# Patient Record
Sex: Female | Born: 1945 | Hispanic: Yes
Health system: Southern US, Community
[De-identification: ages and names within clinical notes are randomized; demographics above are authoritative.]

## PROBLEM LIST (undated history)

## (undated) DIAGNOSIS — J449 Chronic obstructive pulmonary disease, unspecified: Secondary | ICD-10-CM

---

## 2022-02-17 ENCOUNTER — Emergency Department (HOSPITAL_COMMUNITY): Payer: Medicare HMO

## 2022-02-17 ENCOUNTER — Encounter (HOSPITAL_COMMUNITY): Payer: Self-pay

## 2022-02-17 ENCOUNTER — Emergency Department (HOSPITAL_COMMUNITY)
Admission: EM | Admit: 2022-02-17 | Discharge: 2022-02-17 | Disposition: A | Discharge status: Home / Self Care | Payer: Medicare HMO | Attending: Emergency Medicine | Admitting: Emergency Medicine

## 2022-02-17 DIAGNOSIS — R109 Unspecified abdominal pain: Secondary | ICD-10-CM

## 2022-02-17 DIAGNOSIS — R110 Nausea: Secondary | ICD-10-CM | POA: Insufficient documentation

## 2022-02-17 DIAGNOSIS — M7918 Myalgia, other site: Secondary | ICD-10-CM | POA: Diagnosis not present

## 2022-02-17 DIAGNOSIS — R1032 Left lower quadrant pain: Secondary | ICD-10-CM | POA: Insufficient documentation

## 2022-02-17 LAB — COMPREHENSIVE METABOLIC PANEL
ALT: 13 U/L (ref 0–44)
AST: 17 U/L (ref 15–41)
Albumin: 4.2 g/dL (ref 3.5–5.0)
Alkaline Phosphatase: 93 U/L (ref 38–126)
Anion gap: 6 (ref 5–15)
BUN: 18 mg/dL (ref 8–23)
CO2: 26 mmol/L (ref 22–32)
Calcium: 9.1 mg/dL (ref 8.9–10.3)
Chloride: 105 mmol/L (ref 98–111)
Creatinine, Ser: 0.71 mg/dL (ref 0.44–1.00)
GFR, Estimated: >60 mL/min (ref >60)
Glucose, Bld: 110 mg/dL — ABNORMAL HIGH (ref 70–99)
Potassium: 4 mmol/L (ref 3.5–5.1)
Sodium: 137 mmol/L (ref 135–145)
Total Bilirubin: 0.7 mg/dL (ref 0.3–1.2)
Total Protein: 8.2 g/dL — ABNORMAL HIGH (ref 6.5–8.1)

## 2022-02-17 LAB — URINALYSIS, ROUTINE W REFLEX MICROSCOPIC
Bilirubin Urine: NEGATIVE
Glucose, UA: NEGATIVE mg/dL
Ketones, ur: NEGATIVE mg/dL
Leukocytes,Ua: NEGATIVE
Nitrite: NEGATIVE
Protein, ur: NEGATIVE mg/dL
Specific Gravity, Urine: 1.009 (ref 1.005–1.030)
pH: 5 (ref 5.0–8.0)

## 2022-02-17 LAB — CBC WITH DIFFERENTIAL/PLATELET
Abs Immature Granulocytes: 0.02 10*3/uL (ref 0.00–0.07)
Basophils Absolute: 0.1 10*3/uL (ref 0.0–0.1)
Basophils Relative: 1 %
Eosinophils Absolute: 0.1 10*3/uL (ref 0.0–0.5)
Eosinophils Relative: 2 %
HCT: 44.3 % (ref 36.0–46.0)
Hemoglobin: 15.2 g/dL — ABNORMAL HIGH (ref 12.0–15.0)
Immature Granulocytes: 0 %
Lymphocytes Relative: 12 %
Lymphs Abs: 1.1 10*3/uL (ref 0.7–4.0)
MCH: 32.1 pg (ref 26.0–34.0)
MCHC: 34.3 g/dL (ref 30.0–36.0)
MCV: 93.7 fL (ref 80.0–100.0)
Monocytes Absolute: 0.4 10*3/uL (ref 0.1–1.0)
Monocytes Relative: 5 %
Neutro Abs: 7.2 10*3/uL (ref 1.7–7.7)
Neutrophils Relative %: 80 %
Platelets: 261 10*3/uL (ref 150–400)
RBC: 4.73 MIL/uL (ref 3.87–5.11)
RDW: 12.6 % (ref 11.5–15.5)
WBC: 9 10*3/uL (ref 4.0–10.5)
nRBC: 0 % (ref 0.0–0.2)

## 2022-02-17 LAB — LIPASE, BLOOD: Lipase: 27 U/L (ref 11–51)

## 2022-02-17 MED ORDER — IOHEXOL 350 MG/ML SOLN
100.0000 mL | Freq: Once | INTRAVENOUS | Status: AC | PRN
  Administered 2022-02-17: 100 mL via INTRAVENOUS

## 2022-02-17 MED ORDER — CYCLOBENZAPRINE HCL 7.5 MG PO TABS
7.5000 mg | ORAL_TABLET | Freq: Two times a day (BID) | ORAL | 0 refills | Status: DC | PRN
Start: 2022-02-17 — End: 2023-08-02

## 2022-02-17 MED ORDER — LACTATED RINGERS IV BOLUS
1000.0000 mL | Freq: Once | INTRAVENOUS | Status: AC
  Administered 2022-02-17: 1000 mL via INTRAVENOUS

## 2022-02-17 MED ORDER — ONDANSETRON HCL 4 MG/2ML IJ SOLN
4.0000 mg | Freq: Once | INTRAMUSCULAR | Status: AC
  Administered 2022-02-17: 4 mg via INTRAVENOUS
  Filled 2022-02-17: qty 2

## 2022-02-17 MED ORDER — KETOROLAC TROMETHAMINE 15 MG/ML IJ SOLN
15.0000 mg | Freq: Once | INTRAMUSCULAR | Status: AC
  Administered 2022-02-17: 15 mg via INTRAVENOUS
  Filled 2022-02-17: qty 1

## 2022-02-17 MED ORDER — SODIUM CHLORIDE (PF) 0.9 % IJ SOLN
INTRAMUSCULAR | Status: AC
  Filled 2022-02-17: qty 50

## 2022-02-17 NOTE — ED Triage Notes (Signed)
Pt presents with c/o left lower quad abdominal pain that radiates to her left flank area. Pt reports pain has been present for approx one week, nausea as well. ?

## 2022-02-17 NOTE — Discharge Instructions (Signed)
Your workup today was very reassuring. Your labs looked great and there were no abnormalities seen on you CT scan. We believe that your symptoms may in fact be muscular in nature. I recommend using motrin or tylenol as needed for discomfort, and I have sent you a muscle relaxer that may help as well. If symptoms continue longer than a week, please follow up with your PCP ?

## 2022-02-17 NOTE — ED Provider Notes (Signed)
?[facility] ?Provider Note ? ? ?CSN: [id number] ?Arrival date & time: 02/17/22  [id number] ? ?  ? ?History ? ?Chief Complaint  ?Patient presents with  ? Abdominal Pain  ? ? ?Diana Moody is a 76 y.o. female who presents to the ED for left flank pain radiating to her left lower quadrant  that has been present for about 1 week.  Patient describes the pain as pressure, similar to "giving birth".  Patient's daughter is with her and reports that the left flank also appears swollen to her compared to the right.  She also endorses nausea but denies diarrhea.  She denies chest pain, shortness of breath, urinary symptoms.  No previous history of kidney stones.  No treatment prior to arrival.  Pt has history of cholecystectomy but no other previous history of abdominal surgery. ? ? ?Abdominal Pain ? ?  ? ?Home Medications ?Prior to Admission medications   ?Medication Sig Start Date End Date Taking? Authorizing Provider  ?cyclobenzaprine (FEXMID) 7.5 MG tablet Take 1 tablet (7.5 mg total) by mouth 2 (two) times daily as needed for muscle spasms. 02/17/22  Yes Tonye Pearson, PA-C  ?   ? ?Allergies    ?Patient has no known allergies.   ? ?Review of Systems   ?Review of Systems  ?Gastrointestinal:  Positive for abdominal pain.  ? ?Physical Exam ?Updated Vital Signs ?BP 125/60   Pulse 77   Temp 98 ?F (36.7 ?C)   Resp 17   SpO2 94%  ?Physical Exam ?Vitals and nursing note reviewed.  ?Constitutional:   ?   General: She is not in acute distress. ?   Appearance: She is not ill-appearing.  ?HENT:  ?   Head: Atraumatic.  ?Eyes:  ?   Conjunctiva/sclera: Conjunctivae normal.  ?Cardiovascular:  ?   Rate and Rhythm: Normal rate and regular rhythm.  ?   Pulses: Normal pulses.  ?   Heart sounds: No murmur heard. ?Pulmonary:  ?   Effort: Pulmonary effort is normal. No respiratory distress.  ?   Breath sounds: Normal breath sounds.  ?Abdominal:  ?   General: Abdomen is flat. There is no distension.  ?    Palpations: Abdomen is soft.  ?   Tenderness: There is abdominal tenderness.  ?   Comments: Tender to palpation of the left flank rating down to the left lower quadrant.  Left CVA tenderness. ? ?Negative Murphy sign, negative McBurney ? ?No appreciable swelling of the left flank  ?Musculoskeletal:     ?   General: Normal range of motion.  ?   Cervical back: Normal range of motion.  ?Skin: ?   General: Skin is warm and dry.  ?   Capillary Refill: Capillary refill takes less than 2 seconds.  ?Neurological:  ?   General: No focal deficit present.  ?   Mental Status: She is alert.  ?Psychiatric:     ?   Mood and Affect: Mood normal.  ? ? ?ED Results / Procedures / Treatments   ?Labs ?(all labs ordered are listed, but only abnormal results are displayed) ?Labs Reviewed  ?COMPREHENSIVE METABOLIC PANEL - Abnormal; Notable for the following components:  ?    Result Value  ? Glucose, Bld 110 (*)   ? Total Protein 8.2 (*)   ? All other components within normal limits  ?CBC WITH DIFFERENTIAL/PLATELET - Abnormal; Notable for the following components:  ? Hemoglobin 15.2 (*)   ? All other components within normal limits  ?  URINALYSIS, ROUTINE W REFLEX MICROSCOPIC - Abnormal; Notable for the following components:  ? Color, Urine STRAW (*)   ? Hgb urine dipstick MODERATE (*)   ? Bacteria, UA RARE (*)   ? All other components within normal limits  ?LIPASE, BLOOD  ? ? ?EKG ?None ? ?Radiology ?CT Angio Abd/Pel W and/or Wo Contrast ? ?Result Date: 02/17/2022 ?CLINICAL DATA:  Left lower quadrant pain x1 week, nausea EXAM: CTA ABDOMEN AND PELVIS WITHOUT AND WITH CONTRAST TECHNIQUE: Multidetector CT imaging of the abdomen and pelvis was performed using the standard protocol during bolus administration of intravenous contrast. Multiplanar reconstructed images and MIPs were obtained and reviewed to evaluate the vascular anatomy. RADIATION DOSE REDUCTION: This exam was performed according to the departmental dose-optimization program which  includes automated exposure control, adjustment of the mA and/or kV according to patient size and/or use of iterative reconstruction technique. CONTRAST:  176mL OMNIPAQUE IOHEXOL 350 MG/ML SOLN COMPARISON:  MR 12/06/2020, and previous FINDINGS: VASCULAR Aorta: Moderate scattered calcified atheromatous plaque. No aneurysm, dissection, or stenosis. Celiac: Patent without evidence of aneurysm, dissection, vasculitis or significant stenosis. SMA: Calcified ostial plaque resulting in short segment stenosis of at least mild severity, mildly atheromatous but patent distally with classic distal branch anatomy. Renals: Single bilaterally, both with partially calcified ostial plaque, no high-grade stenosis. IMA: Patent without evidence of aneurysm, dissection, vasculitis or significant stenosis. Inflow: Scattered common and internal iliac calcified plaque without aneurysm or stenosis. Proximal Outflow: Mildly atheromatous, patent Veins: Patent hepatic veins, portal vein, SM V, splenic vein, bilateral renal veins. Iliac venous system and IVC unremarkable. No venous pathology evident. Review of the MIP images confirms the above findings. NON-VASCULAR Lower chest: No pleural or pericardial effusion. Coronary calcifications. Hepatobiliary: Post cholecystectomy. Mild prominence of the central intrahepatic biliary tree and CBD, grossly stable since 11/08/2020. 2.3 cm benign hepatic cyst in segment 5. no new liver lesion. Pancreas: Unremarkable. No pancreatic ductal dilatation or surrounding inflammatory changes. Spleen: Normal in size without focal abnormality. Adrenals/Urinary Tract: 2.4 cm left adrenal nodule, previously characterized as adenoma on MRI. Right adrenal fullness, stable. Symmetric renal parenchymal enhancement without focal lesion or hydronephrosis. Urinary bladder physiologically distended. Stomach/Bowel: Stomach incompletely distended, unremarkable. Small bowel decompressed. Normal appendix. The colon is  nondilated with scattered descending and sigmoid diverticula; no significant adjacent inflammatory change. Lymphatic: No abdominal or pelvic adenopathy. Reproductive: Uterus and bilateral adnexa are unremarkable. Other: No ascites.  No free air. Musculoskeletal: Mild facet DJD in the lower lumbar spine. IMPRESSION: 1. No acute findings. 2. Coronary and aortic Atherosclerosis (ICD10-170.0) involving branch vessels without high-grade stenosis. 3. Descending and sigmoid diverticulosis 4. Stable left adrenal benign adenoma. Electronically Signed   By: Lucrezia Europe M.D.   On: 02/17/2022 09:39   ? ?Procedures ?Procedures  ? ? ?Medications Ordered in ED ?Medications  ?lactated ringers bolus 1,000 mL (0 mLs Intravenous Stopped 02/17/22 0918)  ?ketorolac (TORADOL) 15 MG/ML injection 15 mg (15 mg Intravenous Given 02/17/22 0757)  ?ondansetron [facility]) injection 4 mg (4 mg Intravenous Given 02/17/22 0757)  ?iohexol (OMNIPAQUE) 350 MG/ML injection 100 mL (100 mLs Intravenous Contrast Given 02/17/22 0852)  ?sodium chloride (PF) 0.9 % injection (  Given by Other 02/17/22 0912)  ? ? ?ED Course/ Medical Decision Making/ A&P ?  ?                        ?Medical Decision Making ?Amount and/or Complexity of Data Reviewed ?Labs: ordered. ?Radiology: ordered. ? ?Risk ?Prescription  drug management. ? ? ?History:  ?Per HPI ?Social determinants of health:  ?Social History  ? ?Socioeconomic History  ? Marital status: Widowed  ?  Spouse name: Not on file  ? Number of children: Not on file  ? Years of education: Not on file  ? Highest education level: Not on file  ?Occupational History  ? Not on file  ?Tobacco Use  ? Smoking status: Not on file  ? Smokeless tobacco: Not on file  ?Substance and Sexual Activity  ? Alcohol use: Not on file  ? Drug use: Not on file  ? Sexual activity: Not on file  ?Other Topics Concern  ? Not on file  ?Social History Narrative  ? Not on file  ? ?Social Determinants of Health  ? ?Financial Resource Strain: Not on file  ?Food  Insecurity: Not on file  ?Transportation Needs: Not on file  ?Physical Activity: Not on file  ?Stress: Not on file  ?Social Connections: Not on file  ?Intimate Partner Violence: Not on file  ? ? ? ?Initial impression: ? ?This p

## 2022-07-17 LAB — COLOGUARD

## 2022-09-03 LAB — COLOGUARD: COLOGUARD: POSITIVE — AB

## 2023-05-07 IMAGING — CT CT CTA ABD/PEL W/CM AND/OR W/O CM
2 of 10 series · 12 of 46 positions shown, 17 images · IV contrast (OMNIPAQUE 350)
Comparison: MR 12/06/2020, and previous

CLINICAL DATA: Left lower quadrant pain x1 week, nausea

EXAM:
CTA ABDOMEN AND PELVIS WITHOUT AND WITH CONTRAST
TECHNIQUE: Multidetector CT imaging of the abdomen and pelvis was performed
using the standard protocol during bolus administration of
intravenous contrast. Multiplanar reconstructed images and MIPs were
obtained and reviewed to evaluate the vascular anatomy.

[Series 6: coronal mpr · coronal · 0.72mm/px · 2 of 167 slices shown]
[im 56/167  soft-tissue]
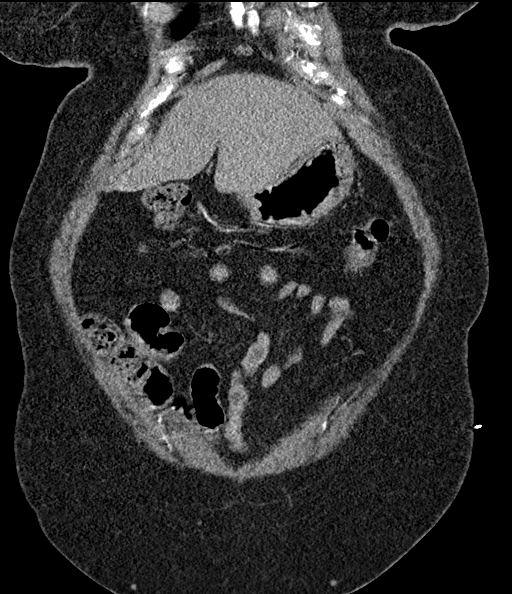
[im 111/167  soft-tissue]
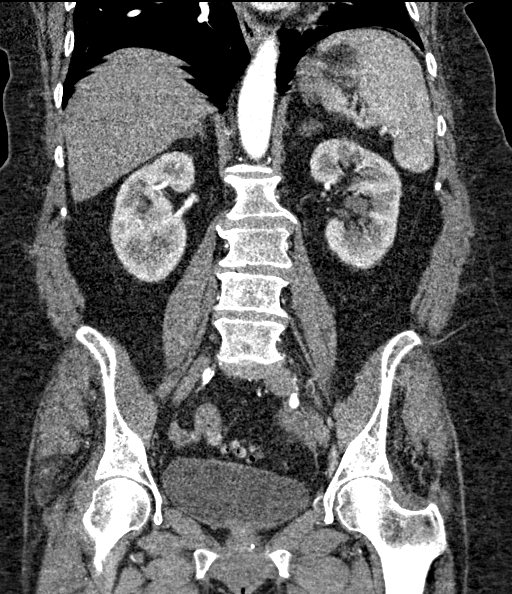

[Series 11: axial venous · axial · portal-venous · 0.68mm/px · z∈[-465,-107]mm · 10 of 215 slices shown, 15 images]
[im 18/215  soft-tissue]
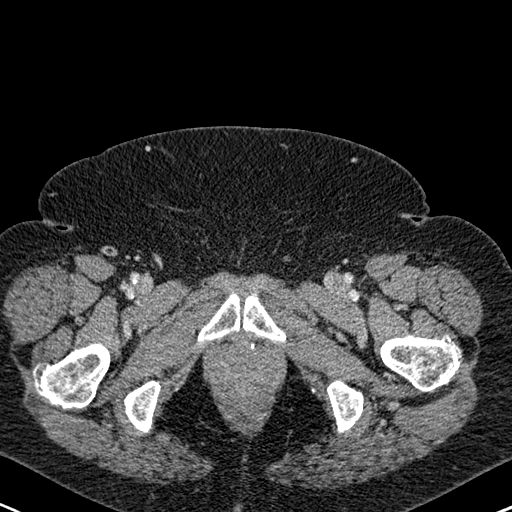
[im 18/215  bone]
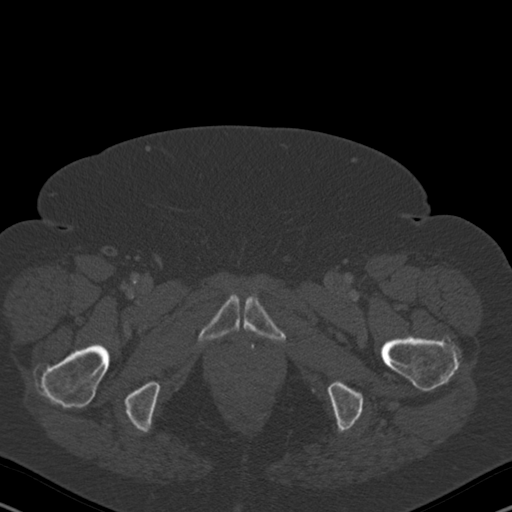
[im 36/215  soft-tissue]
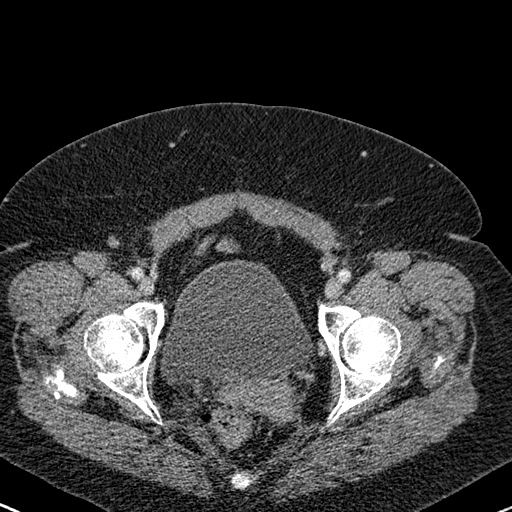
[im 72/215  soft-tissue]
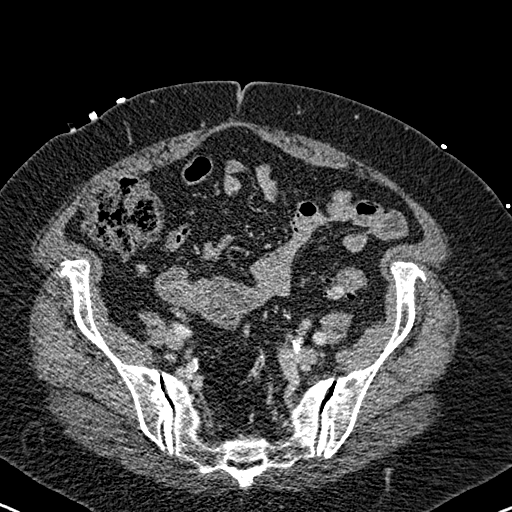
[im 90/215  soft-tissue]
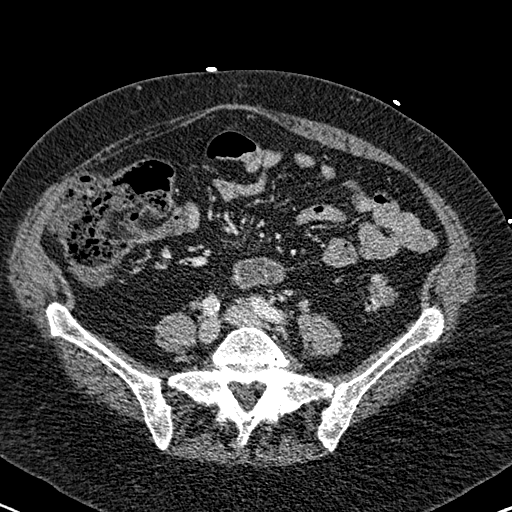
[im 108/215  soft-tissue]
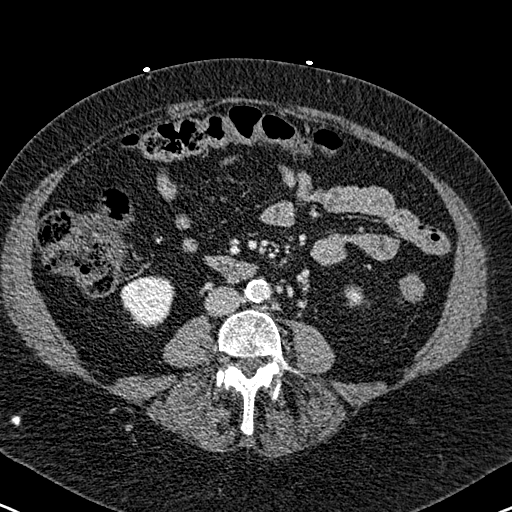
[im 125/215  soft-tissue]
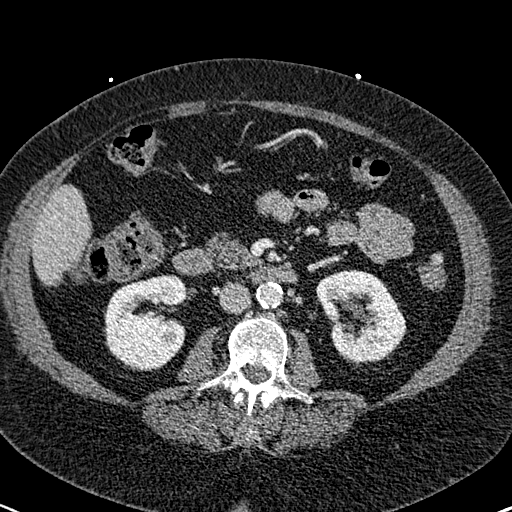
[im 143/215  soft-tissue]
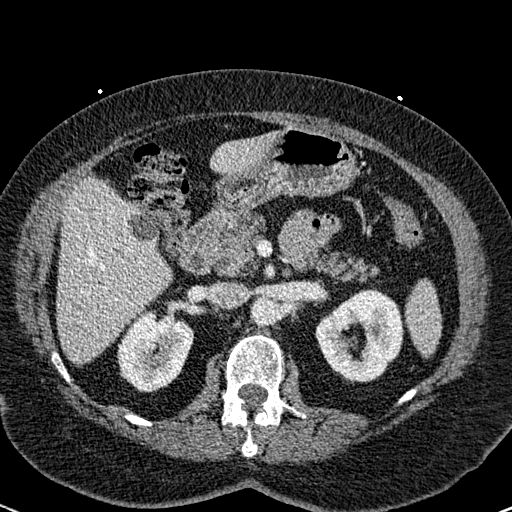
[im 143/215  lung]
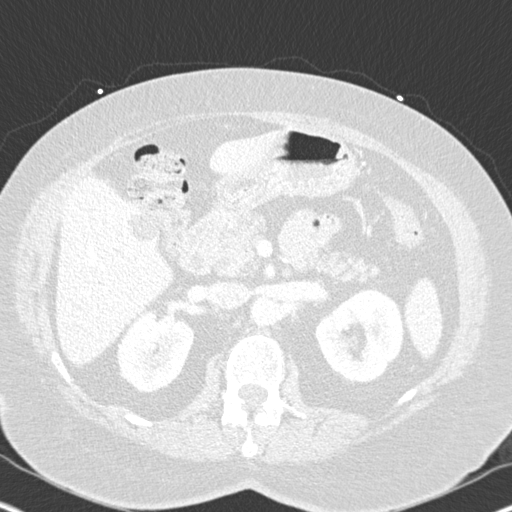
[im 161/215  lung]
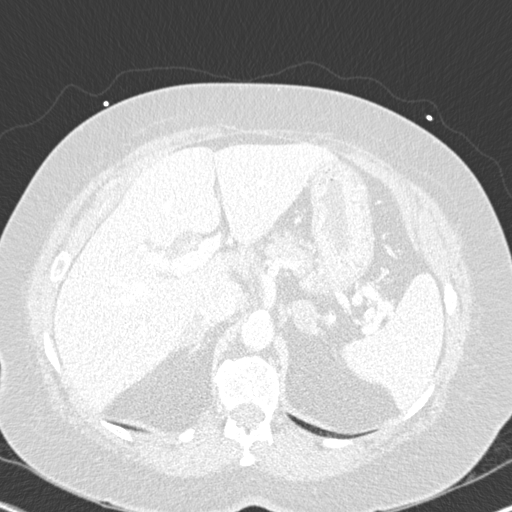
[im 179/215  soft-tissue]
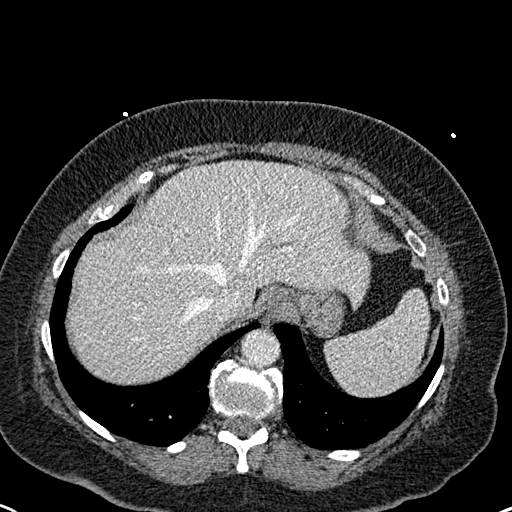
[im 179/215  lung]
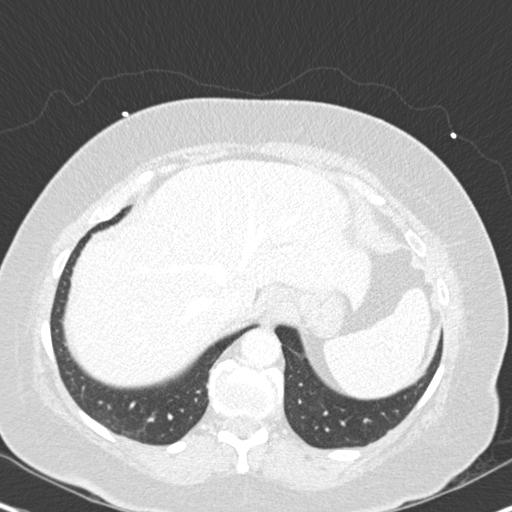
[im 197/215  soft-tissue]
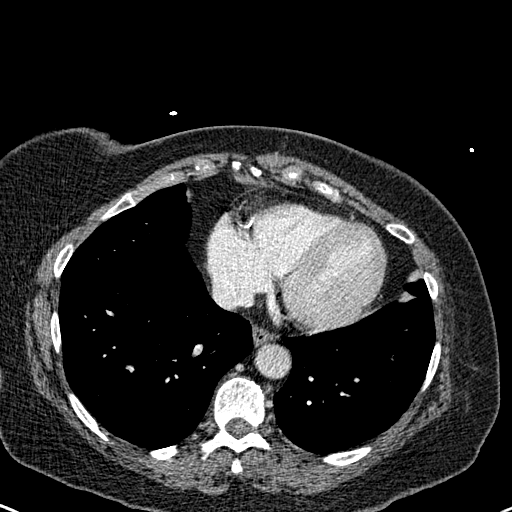
[im 197/215  lung]
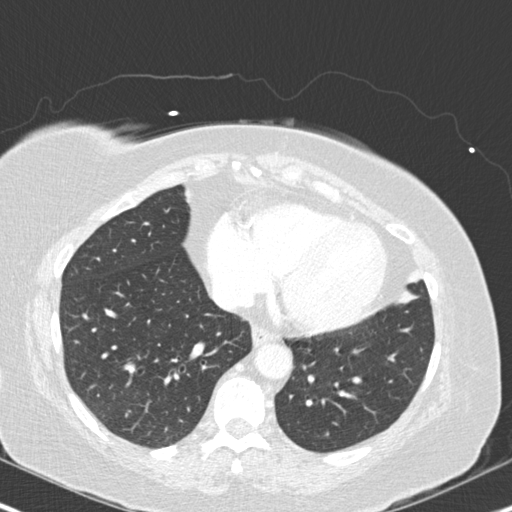
[im 197/215  bone]
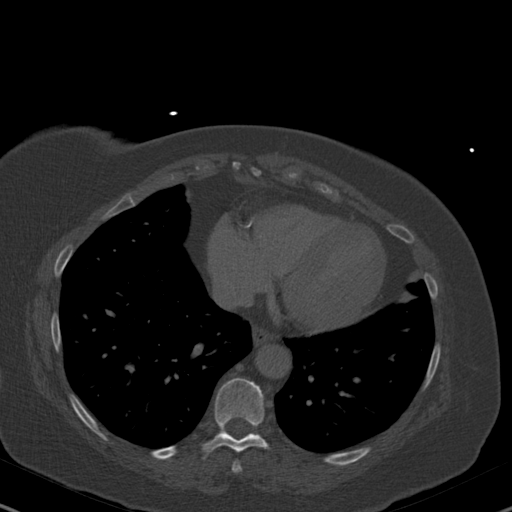

[12 of 46 positions shown; findings below may reference images not displayed]

RADIATION DOSE REDUCTION: This exam was performed according to the
departmental dose-optimization program which includes automated
exposure control, adjustment of the mA and/or kV according to
patient size and/or use of iterative reconstruction technique.

CONTRAST:  100mL OMNIPAQUE IOHEXOL 350 MG/ML SOLN
FINDINGS: VASCULAR

Aorta: Moderate scattered calcified atheromatous plaque. No
aneurysm, dissection, or stenosis.

Celiac: Patent without evidence of aneurysm, dissection, vasculitis
or significant stenosis.

SMA: Calcified ostial plaque resulting in short segment stenosis of
at least mild severity, mildly atheromatous but patent distally with
classic distal branch anatomy.

Renals: Single bilaterally, both with partially calcified ostial
plaque, no high-grade stenosis.

IMA: Patent without evidence of aneurysm, dissection, vasculitis or
significant stenosis.

Inflow: Scattered common and internal iliac calcified plaque without
aneurysm or stenosis.

Proximal Outflow: Mildly atheromatous, patent

Veins: Patent hepatic veins, portal vein, [REDACTED] V, splenic vein,
bilateral renal veins. Iliac venous system and IVC unremarkable. No
venous pathology evident.

Review of the MIP images confirms the above findings.

NON-VASCULAR

Lower chest: No pleural or pericardial effusion. Coronary
calcifications.

Hepatobiliary: Post cholecystectomy. Mild prominence of the central
intrahepatic biliary tree and CBD, grossly stable since 11/08/2020.
[DATE] cm benign hepatic cyst in segment 5. no new liver lesion.

Pancreas: Unremarkable. No pancreatic ductal dilatation or
surrounding inflammatory changes.

Spleen: Normal in size without focal abnormality.

Adrenals/Urinary Tract: 2.4 cm left adrenal nodule, previously
characterized as adenoma on MRI. Right adrenal fullness, stable.
Symmetric renal parenchymal enhancement without focal lesion or
hydronephrosis. Urinary bladder physiologically distended.

Stomach/Bowel: Stomach incompletely distended, unremarkable. Small
bowel decompressed. Normal appendix. The colon is nondilated with
scattered descending and sigmoid diverticula; no significant
adjacent inflammatory change.

Lymphatic: No abdominal or pelvic adenopathy.

Reproductive: Uterus and bilateral adnexa are unremarkable.

Other: No ascites.  No free air.

Musculoskeletal: Mild facet DJD in the lower lumbar spine.
IMPRESSION: 1. No acute findings.
2. Coronary and aortic Atherosclerosis (S7V36-170.0) involving
branch vessels without high-grade stenosis.
3. Descending and sigmoid diverticulosis
4. Stable left adrenal benign adenoma.

## 2023-08-02 ENCOUNTER — Encounter (HOSPITAL_COMMUNITY): Payer: Self-pay | Admitting: Emergency Medicine

## 2023-08-02 ENCOUNTER — Emergency Department (HOSPITAL_COMMUNITY): Payer: Medicare Other

## 2023-08-02 ENCOUNTER — Observation Stay (HOSPITAL_COMMUNITY)
Admission: EM | Admit: 2023-08-02 | Discharge: 2023-08-03 | Disposition: A | Discharge status: Home / Self Care | Payer: Medicare Other | Attending: Family Medicine | Admitting: Family Medicine

## 2023-08-02 ENCOUNTER — Other Ambulatory Visit: Payer: Self-pay

## 2023-08-02 DIAGNOSIS — Z1152 Encounter for screening for COVID-19: Secondary | ICD-10-CM | POA: Insufficient documentation

## 2023-08-02 DIAGNOSIS — Z79899 Other long term (current) drug therapy: Secondary | ICD-10-CM | POA: Diagnosis not present

## 2023-08-02 DIAGNOSIS — I10 Essential (primary) hypertension: Secondary | ICD-10-CM | POA: Diagnosis not present

## 2023-08-02 DIAGNOSIS — R0602 Shortness of breath: Secondary | ICD-10-CM | POA: Diagnosis present

## 2023-08-02 DIAGNOSIS — J441 Chronic obstructive pulmonary disease with (acute) exacerbation: Principal | ICD-10-CM | POA: Insufficient documentation

## 2023-08-02 DIAGNOSIS — F17210 Nicotine dependence, cigarettes, uncomplicated: Secondary | ICD-10-CM | POA: Insufficient documentation

## 2023-08-02 HISTORY — DX: Chronic obstructive pulmonary disease, unspecified: J44.9

## 2023-08-02 LAB — CBC
HCT: 42.2 % (ref 36.0–46.0)
Hemoglobin: 14.2 g/dL (ref 12.0–15.0)
MCH: 31.7 pg (ref 26.0–34.0)
MCHC: 33.6 g/dL (ref 30.0–36.0)
MCV: 94.2 fL (ref 80.0–100.0)
Platelets: 282 10*3/uL (ref 150–400)
RBC: 4.48 MIL/uL (ref 3.87–5.11)
RDW: 12.7 % (ref 11.5–15.5)
WBC: 10.5 10*3/uL (ref 4.0–10.5)
nRBC: 0 % (ref 0.0–0.2)

## 2023-08-02 LAB — BASIC METABOLIC PANEL
Anion gap: 10 (ref 5–15)
BUN: 20 mg/dL (ref 8–23)
CO2: 24 mmol/L (ref 22–32)
Calcium: 8.9 mg/dL (ref 8.9–10.3)
Chloride: 106 mmol/L (ref 98–111)
Creatinine, Ser: 0.76 mg/dL (ref 0.44–1.00)
GFR, Estimated: >60 mL/min (ref >60)
Glucose, Bld: 110 mg/dL — ABNORMAL HIGH (ref 70–99)
Potassium: 3.7 mmol/L (ref 3.5–5.1)
Sodium: 140 mmol/L (ref 135–145)

## 2023-08-02 LAB — RESP PANEL BY RT-PCR (RSV, FLU A&B, COVID)  RVPGX2
Influenza A by PCR: NEGATIVE
Influenza B by PCR: NEGATIVE
Resp Syncytial Virus by PCR: NEGATIVE
SARS Coronavirus 2 by RT PCR: NEGATIVE

## 2023-08-02 MED ORDER — AMLODIPINE BESYLATE 5 MG PO TABS
5.0000 mg | ORAL_TABLET | Freq: Every day | ORAL | Status: DC
  Administered 2023-08-02 – 2023-08-03 (×2): 5 mg via ORAL
  Filled 2023-08-02 (×2): qty 1

## 2023-08-02 MED ORDER — ONDANSETRON HCL 4 MG PO TABS: 4.0000 mg | ORAL_TABLET | Freq: Four times a day (QID) | ORAL | Status: DC | PRN

## 2023-08-02 MED ORDER — ACETAMINOPHEN 650 MG RE SUPP: 650.0000 mg | Freq: Four times a day (QID) | RECTAL | Status: DC | PRN

## 2023-08-02 MED ORDER — ALBUTEROL SULFATE (2.5 MG/3ML) 0.083% IN NEBU
5.0000 mg | INHALATION_SOLUTION | Freq: Once | RESPIRATORY_TRACT | Status: AC
  Administered 2023-08-02: 5 mg via RESPIRATORY_TRACT
  Filled 2023-08-02: qty 6

## 2023-08-02 MED ORDER — ALBUTEROL SULFATE (2.5 MG/3ML) 0.083% IN NEBU: 2.5000 mg | INHALATION_SOLUTION | RESPIRATORY_TRACT | Status: DC | PRN

## 2023-08-02 MED ORDER — ENOXAPARIN SODIUM 40 MG/0.4ML IJ SOSY
40.0000 mg | PREFILLED_SYRINGE | INTRAMUSCULAR | Status: DC
  Administered 2023-08-02 – 2023-08-03 (×2): 40 mg via SUBCUTANEOUS
  Filled 2023-08-02 (×2): qty 0.4

## 2023-08-02 MED ORDER — METHYLPREDNISOLONE SODIUM SUCC 40 MG IJ SOLR
40.0000 mg | Freq: Two times a day (BID) | INTRAMUSCULAR | Status: DC
  Administered 2023-08-02 – 2023-08-03 (×2): 40 mg via INTRAVENOUS
  Filled 2023-08-02 (×2): qty 1

## 2023-08-02 MED ORDER — IPRATROPIUM-ALBUTEROL 0.5-2.5 (3) MG/3ML IN SOLN
3.0000 mL | Freq: Three times a day (TID) | RESPIRATORY_TRACT | Status: DC
  Administered 2023-08-02 – 2023-08-03 (×3): 3 mL via RESPIRATORY_TRACT
  Filled 2023-08-02 (×3): qty 3

## 2023-08-02 MED ORDER — IPRATROPIUM-ALBUTEROL 0.5-2.5 (3) MG/3ML IN SOLN
3.0000 mL | Freq: Four times a day (QID) | RESPIRATORY_TRACT | Status: DC
  Administered 2023-08-02: 3 mL via RESPIRATORY_TRACT
  Filled 2023-08-02: qty 3

## 2023-08-02 MED ORDER — ONDANSETRON HCL 4 MG/2ML IJ SOLN: 4.0000 mg | Freq: Four times a day (QID) | INTRAMUSCULAR | Status: DC | PRN

## 2023-08-02 MED ORDER — ACETAMINOPHEN 325 MG PO TABS: 650.0000 mg | ORAL_TABLET | Freq: Four times a day (QID) | ORAL | Status: DC | PRN

## 2023-08-02 MED ORDER — TRAZODONE HCL 50 MG PO TABS: 25.0000 mg | ORAL_TABLET | Freq: Every evening | ORAL | Status: DC | PRN

## 2023-08-02 NOTE — Plan of Care (Signed)

## 2023-08-02 NOTE — Progress Notes (Signed)
Ambulated patient in hallway on room air, O2 sat started at 98% when nasal cannula was removed, after walking the length of the hallway and back her O2 sat was at 90%, patient recovered to 94% once back in bed.

## 2023-08-02 NOTE — ED Notes (Signed)
ED TO INPATIENT HANDOFF REPORT  Name/Age/Gender Diana Moody 77 y.o. female  Code Status    Code Status Orders  (From admission, onward)           Start     Ordered   08/02/23 1022  Full code  Continuous       Question:  By:  Answer:  Consent: discussion documented in EHR   08/02/23 1021           Code Status History     This patient has a current code status but no historical code status.       Home/SNF/Other Home  Chief Complaint COPD with acute exacerbation (HCC) [J44.1]  Level of Care/Admitting Diagnosis ED Disposition     ED Disposition  Admit   Condition  --   Comment  H[facility]: [facility] [[id number]]  Level of Care: Med-Surg [16]  May [facility] patient in [facility] at [facility] or [facility] if equivalent level of care is available:: Yes  Covid Evaluation: Confirmed COVID Negative  Diagnosis: COPD with acute exacerbation [facility]) [[id number]]  Admitting Physician: Maryln Gottron [[id number]]  Attending Physician: Olexa.Dam, MIR Jaxson.Roy [[id number]]          Medical History Past Medical History:  Diagnosis Date   COPD (chronic obstructive pulmonary disease) (HCC)     Allergies No Known Allergies  IV Location/Drains/Wounds Patient Lines/Drains/Airways Status     Active Line/Drains/Airways     Name Placement date Placement time Site Days   Peripheral IV 08/02/23 20 G Left Antecubital 08/02/23  0621  Antecubital  less than 1            Labs/Imaging Results for orders placed or performed during the hospital encounter of 08/02/23 (from the past 48 hour(s))  Resp panel by RT-PCR (RSV, Flu A&B, Covid) Anterior Nasal Swab     Status: None   Collection Time: 08/02/23  7:10 AM   Specimen: Anterior Nasal Swab  Result Value Ref Range   SARS Coronavirus 2 by RT PCR NEGATIVE NEGATIVE    Comment: (NOTE) SARS-CoV-2 target nucleic acids are NOT DETECTED.  The SARS-CoV-2 RNA is generally detectable in upper respiratory specimens  during the acute phase of infection. The lowest concentration of SARS-CoV-2 viral copies this assay can detect is 138 copies/mL. A negative result does not preclude SARS-Cov-2 infection and should not be used as the sole basis for treatment or other patient management decisions. A negative result may occur with  improper specimen collection/handling, submission of specimen other than nasopharyngeal swab, presence of viral mutation(s) within the areas targeted by this assay, and inadequate number of viral copies(<138 copies/mL). A negative result must be combined with clinical observations, patient history, and epidemiological information. The expected result is Negative.  Fact Sheet for Patients:  BloggerCourse.com  Fact Sheet for Healthcare Providers:  SeriousBroker.it  This test is no t yet approved or cleared by the [place] FDA and  has been authorized for detection and/or diagnosis of SARS-CoV-2 by FDA under an Emergency Use Authorization (EUA). This EUA will remain  in effect (meaning this test can be used) for the duration of the COVID-19 declaration under Section 564(b)(1) of the Act, 21 U.S.C.section 360bbb-3(b)(1), unless the authorization is terminated  or revoked sooner.       Influenza A by PCR NEGATIVE NEGATIVE   Influenza B by PCR NEGATIVE NEGATIVE    Comment: (NOTE) The Xpert Xpress SARS-CoV-2/FLU/RSV plus assay is intended as an aid in the diagnosis  of influenza from Nasopharyngeal swab specimens and should not be used as a sole basis for treatment. Nasal washings and aspirates are unacceptable for Xpert Xpress SARS-CoV-2/FLU/RSV testing.  Fact Sheet for Patients: BloggerCourse.com  Fact Sheet for Healthcare Providers: SeriousBroker.it  This test is not yet approved or cleared by the [place] FDA and has been authorized for detection and/or diagnosis  of SARS-CoV-2 by FDA under an Emergency Use Authorization (EUA). This EUA will remain in effect (meaning this test can be used) for the duration of the COVID-19 declaration under Section 564(b)(1) of the Act, 21 U.S.C. section 360bbb-3(b)(1), unless the authorization is terminated or revoked.     Resp Syncytial Virus by PCR NEGATIVE NEGATIVE    Comment: (NOTE) Fact Sheet for Patients: BloggerCourse.com  Fact Sheet for Healthcare Providers: SeriousBroker.it  This test is not yet approved or cleared by the [place] FDA and has been authorized for detection and/or diagnosis of SARS-CoV-2 by FDA under an Emergency Use Authorization (EUA). This EUA will remain in effect (meaning this test can be used) for the duration of the COVID-19 declaration under Section 564(b)(1) of the Act, 21 U.S.C. section 360bbb-3(b)(1), unless the authorization is terminated or revoked.  Performed at [facility], [address], [place], [place] [address]   CBC     Status: None   Collection Time: 08/02/23  7:20 AM  Result Value Ref Range   WBC 10.5 4.0 - 10.5 K/uL   RBC 4.48 3.87 - 5.11 MIL/uL   Hemoglobin 14.2 12.0 - 15.0 g/dL   HCT 11.9 14.7 - 82.9 %   MCV 94.2 80.0 - 100.0 fL   MCH 31.7 26.0 - 34.0 pg   MCHC 33.6 30.0 - 36.0 g/dL   RDW 56.2 13.0 - 86.5 %   Platelets 282 150 - 400 K/uL   nRBC 0.0 0.0 - 0.2 %    Comment: Performed at [facility], [address], [place], [place] [address]  Basic metabolic panel     Status: Abnormal   Collection Time: 08/02/23  7:20 AM  Result Value Ref Range   Sodium 140 135 - 145 mmol/L   Potassium 3.7 3.5 - 5.1 mmol/L   Chloride 106 98 - 111 mmol/L   CO2 24 22 - 32 mmol/L   Glucose, Bld 110 (H) 70 - 99 mg/dL    Comment: Glucose reference range applies only to samples taken after fasting for at least 8 hours.   BUN 20 8 - 23 mg/dL   Creatinine, Ser 6.29 0.44 - 1.00  mg/dL   Calcium 8.9 8.9 - 52.8 mg/dL   GFR, Estimated >41 >32 mL/min    Comment: (NOTE) Calculated using the CKD-EPI Creatinine Equation (2021)    Anion gap 10 5 - 15    Comment: Performed at [facility], [address] [place], [place] [address]   DG Chest Port 1 View  Result Date: 08/02/2023 CLINICAL DATA:  77 year old female with history of shortness of breath. Recently diagnosed with pneumonia. EXAM: PORTABLE CHEST 1 VIEW COMPARISON:  Chest x-ray 09/07/2020. FINDINGS: Lung volumes are normal. No consolidative airspace disease. Mild linear scarring in the left mid lung, unchanged. No pleural effusions. No pneumothorax. No pulmonary nodule or mass noted. Pulmonary vasculature and the cardiomediastinal silhouette are within normal limits. Atherosclerosis in the thoracic aorta. IMPRESSION: 1.  No radiographic evidence of acute cardiopulmonary disease. 2. Aortic atherosclerosis. Electronically Signed   By: Trudie Reed M.D.   On: 08/02/2023  08:36    Pending Labs Unresulted Labs (From admission, onward)     Start     Ordered   08/03/23 0500  Basic metabolic panel  Tomorrow morning,   R        08/02/23 1021   08/03/23 0500  CBC  Tomorrow morning,   R        08/02/23 1021            Vitals/Pain Today's Vitals   08/02/23 0800 08/02/23 0841 08/02/23 0900 08/02/23 0930  BP: (!) 120/59 112/66 (!) 113/59 121/64  Pulse: 98 89 91 98  Resp: (!) 22 18 19 20   Temp:      TempSrc:      SpO2: 91% 92% 91% 90%  Weight:      Height:      PainSc:        Isolation Precautions No active isolations  Medications Medications  ipratropium-albuterol (DUONEB) 0.5-2.5 (3) MG/3ML nebulizer solution 3 mL (has no administration in time range)  methylPREDNISolone sodium succinate (SOLU-MEDROL) 40 mg/mL injection 40 mg (has no administration in time range)  enoxaparin (LOVENOX) injection 40 mg (has no administration in time range)  acetaminophen (TYLENOL) tablet 650 mg (has  no administration in time range)    Or  acetaminophen (TYLENOL) suppository 650 mg (has no administration in time range)  traZODone (DESYREL) tablet 25 mg (has no administration in time range)  ondansetron (ZOFRAN) tablet 4 mg (has no administration in time range)    Or  ondansetron (ZOFRAN) injection 4 mg (has no administration in time range)  albuterol (PROVENTIL) (2.5 MG/3ML) 0.083% nebulizer solution 2.5 mg (has no administration in time range)  albuterol (PROVENTIL) (2.5 MG/3ML) 0.083% nebulizer solution 5 mg (5 mg Nebulization Given 08/02/23 0716)  albuterol (PROVENTIL) (2.5 MG/3ML) 0.083% nebulizer solution 5 mg (5 mg Nebulization Given 08/02/23 8657)    Mobility walks with person assist

## 2023-08-02 NOTE — ED Provider Notes (Signed)
[place] EMERGENCY DEPARTMENT AT [facility] Provider Note   CSN: [id number] Arrival date & time: 08/02/23  7829     History  Chief Complaint  Patient presents with   Shortness of Breath    Diana Moody is a 77 y.o. female.  HPI 77 yo female with ho copd presents with dyspnea.  She reports she had symptoms beginning on Monday.  Seen at [facility] and started on zithoromax and doxy, which she completed.  Given steroid ;shot.  She has continued to use her home nebs and increaeing dyspnea since yesterday when she completed abx.  No flu shots. Lives in home with daughter and family.  Patietn continues to smoke daily.  No flu shot. HO hospitalization for copd NO N/V/D     Home Medications Prior to Admission medications   Medication Sig Start Date End Date Taking? Authorizing Provider  cyclobenzaprine (FEXMID) 7.5 MG tablet Take 1 tablet (7.5 mg total) by mouth 2 (two) times daily as needed for muscle spasms. 02/17/22   Janell Quiet, PA-C      Allergies    Patient has no known allergies.    Review of Systems   Review of Systems  Physical Exam Updated Vital Signs BP 121/64 (BP Location: Right Arm)   Pulse 98   Temp 97.9 F (36.6 C) (Oral)   Resp 20   Ht 1.6 m (5\' 3" )   Wt 87.5 kg   SpO2 90%   BMI 34.19 kg/m  Physical Exam Vitals reviewed.  Constitutional:      Appearance: She is ill-appearing.  HENT:     Head: Normocephalic.     Mouth/Throat:     Mouth: Mucous membranes are moist.  Eyes:     Pupils: Pupils are equal, round, and reactive to light.  Cardiovascular:     Rate and Rhythm: Normal rate and regular rhythm.  Pulmonary:     Effort: Tachypnea present.     Breath sounds: Examination of the right-middle field reveals wheezing. Examination of the left-middle field reveals wheezing. Examination of the right-lower field reveals decreased breath sounds. Examination of the left-lower field reveals decreased breath sounds and rhonchi. Decreased breath  sounds, wheezing and rhonchi present.  Musculoskeletal:        General: Normal range of motion.     Cervical back: Normal range of motion.     Right lower leg: No edema.     Left lower leg: No edema.  Skin:    General: Skin is warm and dry.     Capillary Refill: Capillary refill takes less than 2 seconds.  Neurological:     General: No focal deficit present.     Mental Status: She is alert.  Psychiatric:        Mood and Affect: Mood normal.     ED Results / Procedures / Treatments   Labs (all labs ordered are listed, but only abnormal results are displayed) Labs Reviewed  BASIC METABOLIC PANEL - Abnormal; Notable for the following components:      Result Value   Glucose, Bld 110 (*)    All other components within normal limits  RESP PANEL BY RT-PCR (RSV, FLU A&B, COVID)  [id number]  CBC    EKG EKG Interpretation Date/Time:  Sunday August 02 2023 06:49:05 EDT Ventricular Rate:  109 PR Interval:  141 QRS Duration:  106 QT Interval:  359 QTC Calculation: 484 R Axis:   87  Text Interpretation: Sinus tachycardia Borderline right axis deviation Non-specific ST-t  changes Confirmed by Margarita Grizzle [phone]) on 08/02/2023 7:05:48 AM  Radiology DG Chest Port 1 View  Result Date: 08/02/2023 CLINICAL DATA:  77 year old female with history of shortness of breath. Recently diagnosed with pneumonia. EXAM: PORTABLE CHEST 1 VIEW COMPARISON:  Chest x-Alithea Lapage 09/07/2020. FINDINGS: Lung volumes are normal. No consolidative airspace disease. Mild linear scarring in the left mid lung, unchanged. No pleural effusions. No pneumothorax. No pulmonary nodule or mass noted. Pulmonary vasculature and the cardiomediastinal silhouette are within normal limits. Atherosclerosis in the thoracic aorta. IMPRESSION: 1.  No radiographic evidence of acute cardiopulmonary disease. 2. Aortic atherosclerosis. Electronically Signed   By: Trudie Reed M.D.   On: 08/02/2023 08:36    Procedures .Critical  Care  Performed by: Margarita Grizzle, MD Authorized by: Margarita Grizzle, MD   Critical care provider statement:    Critical care time (minutes):  45   Critical care end time:  08/02/2023 9:52 AM   Critical care was necessary to treat or prevent imminent or life-threatening deterioration of the following conditions:  Respiratory failure   Critical care was time spent personally by me on the following activities:  Development of treatment plan with patient or surrogate, discussions with consultants, evaluation of patient's response to treatment, examination of patient, ordering and review of laboratory studies, ordering and review of radiographic studies, ordering and performing treatments and interventions, pulse oximetry, re-evaluation of patient's condition and review of old charts     Medications Ordered in ED Medications  albuterol (PROVENTIL) (2.5 MG/3ML) 0.083% nebulizer solution 5 mg (5 mg Nebulization Given 08/02/23 0716)  albuterol (PROVENTIL) (2.5 MG/3ML) 0.083% nebulizer solution 5 mg (5 mg Nebulization Given 08/02/23 6578)    ED Course/ Medical Decision Making/ A&P Clinical Course as of 08/02/23 0953  Sun Aug 02, 2023  0803 CXR no focal infiltrates on my review and interpretation awaiting radiology interpretation [DR]  0820 Covid and flu reviwed and negative [DR]  0822 Patient review of he waited.  She feels somewhat improved.  Her oxygen saturations are 88%. She continues to have some diffuse wheezing. Plan repeat albuterol and will reevaluate.  [DR]  4696 CBC reviewed interpreted and within normal limits Basic metabolic panel reviewed interpreted and within normal limits [DR]  0846 Patient with neb treatment currently [DR]  [phone] Patient reevaluated.  Oxygen saturation is 90 to 93%.  She continues to remain tachypneic but does have decreased wheezing and some improved air movement. [DR]    Clinical Course User Index [DR] Margarita Grizzle, MD                                  Medical Decision Making Amount and/or Complexity of Data Reviewed Labs: ordered. Radiology: ordered.  Risk Prescription drug management.   The me 36-year-old female history of COPD presents today with wheezing and dyspnea Differential diagnosis includes but is not limited to pneumonia, COPD exacerbation, PE, CHF, Patient with normal labs and no focal infiltrates on chest x-Rossetta Kama.  Doubt focal pneumonia, doubt CHF, patient with normal white count and no evidence of acute infection including viral infection with negative COVID and flu test. Patient has improved here after Solu-Medrol prehospital and 10 mg of albuterol.  However she continues to have some borderline oxygen saturations with increased respiratory rate. Plan admission for ongoing evaluation and treatment of COPD exacerbation 10:21 AM Discussed with Dr. Erenest Blank who will see for admission       Final Clinical  Impression(s) / ED Diagnoses Final diagnoses:  COPD exacerbation [facility])    Rx / DC Orders ED Discharge Orders     None         Margarita Grizzle, MD 08/02/23 1021

## 2023-08-02 NOTE — ED Triage Notes (Signed)
PT BIB EMS from home, C/O of shortness of breath. Diagnosed with pneumonia on Monday, prescribed abx, finished them. Felt worst while taking them. Hx of COPD. Has productive cough with mucus. No fever, Denies pain. Took albuterol at home with no relief.  BP 160/80, HR 110  20g Left AC- Duoneb, Albuterol, Solumedrol given en route

## 2023-08-02 NOTE — Plan of Care (Signed)
Problem: Activity: Goal: Risk for activity intolerance will decrease Outcome: Progressing   Problem: Coping: Goal: Level of anxiety will decrease Outcome: Progressing

## 2023-08-02 NOTE — H&P (Signed)
History and Physical  [name] FIE:[id number] DOB: 1946/08/16 DOA: 08/02/2023  PCP: Kerin Salen, PA-C   Chief Complaint: Cough, shortness of breath  HPI: Diana Moody is a 77 y.o. female with medical history significant for, hyperlipidemia, COPD on room air being admitted to the hospital with exacerbation of COPD.  She started having cough and shortness of breath with exertion a little over a week ago, 6 days ago she took a 5-day course of oral antibiotics prescribed by her PCP.  She completed the course of antibiotics, felt that her shortness of breath got slightly better, however came to the hospital for evaluation this morning due to continued dyspnea with exertion and tightness in her chest.  She continues to have a nonproductive cough.  Denies any chest pain, fevers, nausea or vomiting.  Takes Incruse and Symbicort for her COPD and typically it is well-controlled.  ED Course: She was brought in by EMS, they gave her 125 mg Solu-Medrol IV en route.  Here in the emergency department her blood pressure slightly elevated but otherwise vital signs are unremarkable.  She is saturating about 90% on room air at rest.  Lab work is unremarkable, and chest x-ray without any acute findings.  Review of Systems: Please see HPI for pertinent positives and negatives. A complete 10 system review of systems are otherwise negative.  Past Medical History:  Diagnosis Date   COPD (chronic obstructive pulmonary disease) (HCC)    No past surgical history on file.  Social History:  reports that she has been smoking cigarettes. She does not have any smokeless tobacco history on file. She reports that she does not drink alcohol and does not use drugs.   No Known Allergies  No family history on file.   Prior to Admission medications   Medication Sig Start Date End Date Taking? Authorizing Provider  cyclobenzaprine (FEXMID) 7.5 MG tablet Take 1 tablet (7.5 mg total) by mouth 2 (two) times  daily as needed for muscle spasms. 02/17/22   Janell Quiet, PA-C    Physical Exam: BP 121/64 (BP Location: Right Arm)   Pulse 98   Temp 97.9 F (36.6 C) (Oral)   Resp 20   Ht 5\' 3"  (1.6 m)   Wt 87.5 kg   SpO2 90%   BMI 34.19 kg/m   General:  Alert, oriented, calm, in no acute distress  Eyes: EOMI, clear conjuctivae, white sclerea Neck: supple, no masses, trachea mildline  Cardiovascular: RRR, no murmurs or rubs, no peripheral edema  Respiratory: Breath sounds are very distant without much air movement, there is some very slight rhonchi diffusely, but no wheezing.  No current tachypnea, cough or respiratory distress. Abdomen: soft, nontender, nondistended, normal bowel tones heard  Skin: dry, no rashes  Musculoskeletal: no joint effusions, normal range of motion  Psychiatric: appropriate affect, normal speech  Neurologic: extraocular muscles intact, clear speech, moving all extremities with intact sensorium         Labs on Admission:  Basic Metabolic Panel: Recent Labs  Lab 08/02/23 0720  NA 140  K 3.7  CL 106  CO2 24  GLUCOSE 110*  BUN 20  CREATININE 0.76  CALCIUM 8.9   Liver Function Tests: No results for input(s): "AST", "ALT", "ALKPHOS", "BILITOT", "PROT", "ALBUMIN" in the last 168 hours. No results for input(s): "LIPASE", "AMYLASE" in the last 168 hours. No results for input(s): "AMMONIA" in the last 168 hours. CBC: Recent Labs  Lab 08/02/23 0720  WBC 10.5  HGB 14.2  HCT 42.2  MCV 94.2  PLT 282   Cardiac Enzymes: No results for input(s): "CKTOTAL", "CKMB", "CKMBINDEX", "TROPONINI" in the last 168 hours.  BNP (last 3 results) No results for input(s): "BNP" in the last 8760 hours.  ProBNP (last 3 results) No results for input(s): "PROBNP" in the last 8760 hours.  CBG: No results for input(s): "GLUCAP" in the last 168 hours.  Radiological Exams on Admission: DG Chest Port 1 View  Result Date: 08/02/2023 CLINICAL DATA:  77 year old female with  history of shortness of breath. Recently diagnosed with pneumonia. EXAM: PORTABLE CHEST 1 VIEW COMPARISON:  Chest x-ray 09/07/2020. FINDINGS: Lung volumes are normal. No consolidative airspace disease. Mild linear scarring in the left mid lung, unchanged. No pleural effusions. No pneumothorax. No pulmonary nodule or mass noted. Pulmonary vasculature and the cardiomediastinal silhouette are within normal limits. Atherosclerosis in the thoracic aorta. IMPRESSION: 1.  No radiographic evidence of acute cardiopulmonary disease. 2. Aortic atherosclerosis. Electronically Signed   By: Trudie Reed M.D.   On: 08/02/2023 08:36    Assessment/Plan This is a pleasant 77 year old female with a history of hypertension, hyperlipidemia, COPD on room air being admitted to the hospital with acute exacerbation of COPD after recent treatment for community-acquired pneumonia.  Acute exacerbation of COPD-with dyspnea with exertion, nonproductive cough.  Improving with IV steroids. -Observation admission -Supplemental oxygen as needed -Scheduled IV steroids -Incentive spirometry and flutter valve -Scheduled DuoNebs, with as needed albuterol  Hypertension-continue home amlodipine  Hyperlipidemia-continue home statin  DVT prophylaxis: Lovenox     Code Status: Full Code  Consults called: None  Admission status: Observation  Time spent: 46 minutes  Dolphus Linch Sharlette Dense MD [facility] Pager [phone]  If 7PM-7AM, please contact night-coverage [email or web address] Password [email or web address]  08/02/2023, 10:22 AM

## 2023-08-03 ENCOUNTER — Encounter (HOSPITAL_COMMUNITY): Payer: Self-pay | Admitting: Internal Medicine

## 2023-08-03 DIAGNOSIS — J441 Chronic obstructive pulmonary disease with (acute) exacerbation: Secondary | ICD-10-CM | POA: Diagnosis not present

## 2023-08-03 LAB — CBC
HCT: 39.8 % (ref 36.0–46.0)
Hemoglobin: 13.2 g/dL (ref 12.0–15.0)
MCH: 32.1 pg (ref 26.0–34.0)
MCHC: 33.2 g/dL (ref 30.0–36.0)
MCV: 96.8 fL (ref 80.0–100.0)
Platelets: 262 10*3/uL (ref 150–400)
RBC: 4.11 MIL/uL (ref 3.87–5.11)
RDW: 12.7 % (ref 11.5–15.5)
WBC: 12.5 10*3/uL — ABNORMAL HIGH (ref 4.0–10.5)
nRBC: 0 % (ref 0.0–0.2)

## 2023-08-03 LAB — BASIC METABOLIC PANEL
Anion gap: 7 (ref 5–15)
BUN: 26 mg/dL — ABNORMAL HIGH (ref 8–23)
CO2: 24 mmol/L (ref 22–32)
Calcium: 9.1 mg/dL (ref 8.9–10.3)
Chloride: 104 mmol/L (ref 98–111)
Creatinine, Ser: 0.85 mg/dL (ref 0.44–1.00)
GFR, Estimated: >60 mL/min (ref >60)
Glucose, Bld: 150 mg/dL — ABNORMAL HIGH (ref 70–99)
Potassium: 4 mmol/L (ref 3.5–5.1)
Sodium: 135 mmol/L (ref 135–145)

## 2023-08-03 MED ORDER — PREDNISONE 10 MG PO TABS
ORAL_TABLET | ORAL | 0 refills | Status: AC
Start: 2023-08-03 — End: 2023-08-12

## 2023-08-03 MED ORDER — SYMBICORT 160-4.5 MCG/ACT IN AERO: 2.0000 | INHALATION_SPRAY | Freq: Two times a day (BID) | RESPIRATORY_TRACT | 1 refills | Status: AC

## 2023-08-03 MED ORDER — ALBUTEROL SULFATE HFA 108 (90 BASE) MCG/ACT IN AERS: 2.0000 | INHALATION_SPRAY | RESPIRATORY_TRACT | 1 refills | Status: AC | PRN

## 2023-08-03 NOTE — Discharge Summary (Signed)
Physician Discharge Summary   Patient: Diana Moody MRN: [id number] DOB: January 05, 1946  Admit date:     08/02/2023  Discharge date: 08/03/23  Discharge Physician: Brendia Sacks   PCP: Kerin Salen, PA-C   Recommendations at discharge:   Resolution of COPD exacerbation  Discharge Diagnoses: Principal Problem:   COPD with acute exacerbation [facility])  Resolved Problems:   * No resolved hospital problems. *  Hospital Course: 77 year old woman PMH including COPD presented with cough and shortness of breath despite outpatient treatment with oral antibiotics from PCP.  Was treated with IV Solu-Medrol and route.  No hypoxia below 88% documented.  Imaging was unremarkable.  Admitted for further observation and evaluation.  Consultants None  Procedures None  Acute exacerbation of COPD DOE and cough on admission Treated with IV steroids with rapid clinical improvement. Ambulated well by report without hypoxia. Exacerbation appears resolved.  Stable for discharge home.  Refill given for albuterol and Symbicort.   Hypertension-continue home amlodipine   Hyperlipidemia-continue home statin  Disposition: Home Diet recommendation:  Regular diet DISCHARGE MEDICATION: Allergies as of 08/03/2023   No Known Allergies      Medication List     STOP taking these medications    amoxicillin-clavulanate 1000-62.5 MG 12 hr tablet Commonly known as: AUGMENTIN XR   azithromycin 250 MG tablet Commonly known as: ZITHROMAX       TAKE these medications    albuterol 108 (90 Base) MCG/ACT inhaler Commonly known as: VENTOLIN HFA Inhale 2 puffs into the lungs as needed for wheezing or shortness of breath.   amLODipine 5 MG tablet Commonly known as: NORVASC Take by mouth.   Incruse Ellipta 62.5 MCG/ACT Aepb Generic drug: umeclidinium bromide TAKE 1 PUFF BY MOUTH EVERY DAY   predniSONE 10 MG tablet Commonly known as: DELTASONE Take 4 tablets (40 mg total) by mouth  daily for 3 days, THEN 2 tablets (20 mg total) daily for 3 days, THEN 1 tablet (10 mg total) daily for 3 days. Start taking on: August 03, 2023   rosuvastatin 10 MG tablet Commonly known as: CRESTOR Take 10 mg by mouth every evening.   Symbicort 160-4.5 MCG/ACT inhaler Generic drug: budesonide-formoterol Inhale 2 puffs into the lungs in the morning and at bedtime. What changed: See the new instructions.        Follow-up Information     Kerin Salen, PA-C. Schedule an appointment as soon as possible for a visit in 1 week(s).   Specialty: Internal Medicine Contact information: [address] [place] [address] [phone]         Horald Pollen, PA-C. Schedule an appointment as soon as possible for a visit in 1 week(s).   Specialty: Physician Assistant Contact information: [address] [place] [address] [phone]                Feels better Ambulated well, reports no hypoxia Needs Rx for albuterol MDI and Symbicort  Discharge Exam: Filed Weights   08/02/23 0648  Weight: 87.5 kg   Physical Exam Vitals reviewed.  Constitutional:      General: She is not in acute distress.    Appearance: She is not ill-appearing or toxic-appearing.  Cardiovascular:     Rate and Rhythm: Normal rate and regular rhythm.     Heart sounds: No murmur heard. Pulmonary:     Effort: Pulmonary effort is normal. No respiratory distress.     Breath sounds: No wheezing, rhonchi or rales.  Neurological:  Mental Status: She is alert.  Psychiatric:        Mood and Affect: Mood normal.        Behavior: Behavior normal.      Condition at discharge: good  The results of significant diagnostics from this hospitalization (including imaging, microbiology, ancillary and laboratory) are listed below for reference.   Imaging Studies: DG Chest Port 1 View  Result Date: 08/02/2023 CLINICAL DATA:  77 year old female with history of shortness of breath.  Recently diagnosed with pneumonia. EXAM: PORTABLE CHEST 1 VIEW COMPARISON:  Chest x-ray 09/07/2020. FINDINGS: Lung volumes are normal. No consolidative airspace disease. Mild linear scarring in the left mid lung, unchanged. No pleural effusions. No pneumothorax. No pulmonary nodule or mass noted. Pulmonary vasculature and the cardiomediastinal silhouette are within normal limits. Atherosclerosis in the thoracic aorta. IMPRESSION: 1.  No radiographic evidence of acute cardiopulmonary disease. 2. Aortic atherosclerosis. Electronically Signed   By: Trudie Reed M.D.   On: 08/02/2023 08:36    Microbiology: Results for orders placed or performed during the hospital encounter of 08/02/23  Resp panel by RT-PCR (RSV, Flu A&B, Covid) Anterior Nasal Swab     Status: None   Collection Time: 08/02/23  7:10 AM   Specimen: Anterior Nasal Swab  Result Value Ref Range Status   SARS Coronavirus 2 by RT PCR NEGATIVE NEGATIVE Final    Comment: (NOTE) SARS-CoV-2 target nucleic acids are NOT DETECTED.  The SARS-CoV-2 RNA is generally detectable in upper respiratory specimens during the acute phase of infection. The lowest concentration of SARS-CoV-2 viral copies this assay can detect is 138 copies/mL. A negative result does not preclude SARS-Cov-2 infection and should not be used as the sole basis for treatment or other patient management decisions. A negative result may occur with  improper specimen collection/handling, submission of specimen other than nasopharyngeal swab, presence of viral mutation(s) within the areas targeted by this assay, and inadequate number of viral copies(<138 copies/mL). A negative result must be combined with clinical observations, patient history, and epidemiological information. The expected result is Negative.  Fact Sheet for Patients:  BloggerCourse.com  Fact Sheet for Healthcare Providers:  SeriousBroker.it  This test is  no t yet approved or cleared by the [place] FDA and  has been authorized for detection and/or diagnosis of SARS-CoV-2 by FDA under an Emergency Use Authorization (EUA). This EUA will remain  in effect (meaning this test can be used) for the duration of the COVID-19 declaration under Section 564(b)(1) of the Act, 21 U.S.C.section 360bbb-3(b)(1), unless the authorization is terminated  or revoked sooner.       Influenza A by PCR NEGATIVE NEGATIVE Final   Influenza B by PCR NEGATIVE NEGATIVE Final    Comment: (NOTE) The Xpert Xpress SARS-CoV-2/FLU/RSV plus assay is intended as an aid in the diagnosis of influenza from Nasopharyngeal swab specimens and should not be used as a sole basis for treatment. Nasal washings and aspirates are unacceptable for Xpert Xpress SARS-CoV-2/FLU/RSV testing.  Fact Sheet for Patients: BloggerCourse.com  Fact Sheet for Healthcare Providers: SeriousBroker.it  This test is not yet approved or cleared by the [place] FDA and has been authorized for detection and/or diagnosis of SARS-CoV-2 by FDA under an Emergency Use Authorization (EUA). This EUA will remain in effect (meaning this test can be used) for the duration of the COVID-19 declaration under Section 564(b)(1) of the Act, 21 U.S.C. section 360bbb-3(b)(1), unless the authorization is terminated or revoked.     Resp Syncytial Virus by  PCR NEGATIVE NEGATIVE Final    Comment: (NOTE) Fact Sheet for Patients: BloggerCourse.com  Fact Sheet for Healthcare Providers: SeriousBroker.it  This test is not yet approved or cleared by the [place] FDA and has been authorized for detection and/or diagnosis of SARS-CoV-2 by FDA under an Emergency Use Authorization (EUA). This EUA will remain in effect (meaning this test can be used) for the duration of the COVID-19 declaration under Section  564(b)(1) of the Act, 21 U.S.C. section 360bbb-3(b)(1), unless the authorization is terminated or revoked.  Performed at [facility], [address], [place], [place] [address]     Labs: CBC: Recent Labs  Lab 08/02/23 0720 08/03/23 0318  WBC 10.5 12.5*  HGB 14.2 13.2  HCT 42.2 39.8  MCV 94.2 96.8  PLT 282 262   Basic Metabolic Panel: Recent Labs  Lab 08/02/23 0720 08/03/23 0318  NA 140 135  K 3.7 4.0  CL 106 104  CO2 24 24  GLUCOSE 110* 150*  BUN 20 26*  CREATININE 0.76 0.85  CALCIUM 8.9 9.1   Liver Function Tests: No results for input(s): "AST", "ALT", "ALKPHOS", "BILITOT", "PROT", "ALBUMIN" in the last 168 hours. CBG: No results for input(s): "GLUCAP" in the last 168 hours.  Discharge time spent: less than 30 minutes.  Signed: Brendia Sacks, MD [facility] 08/03/2023

## 2023-08-03 NOTE — Progress Notes (Signed)
Discharge package printed and instructions given to pt and daughter.

## 2023-08-03 NOTE — Plan of Care (Signed)
Problem: Activity: Goal: Risk for activity intolerance will decrease Outcome: Progressing   Problem: Safety: Goal: Ability to remain free from injury will improve Outcome: Progressing

## 2023-08-03 NOTE — Progress Notes (Signed)
   08/03/23 1302  TOC Brief Assessment  Insurance and Status Reviewed  Patient has primary care physician Yes  Home environment has been reviewed Resides with children  Prior level of function: Independent at baseline  Prior/Current Home Services No current home services  Social Determinants of Health Reivew SDOH reviewed no interventions necessary  Readmission risk has been reviewed Yes  Transition of care needs no transition of care needs at this time

## 2023-08-03 NOTE — Care Management Obs Status (Signed)
MEDICARE OBSERVATION STATUS NOTIFICATION   Patient Details  Name: Diana Moody MRN: [id number] Date of Birth: [birth year]   Medicare Observation Status Notification Given:  Yes    Ewing Schlein, LCSW 08/03/2023, 12:56 PM

## 2023-08-03 NOTE — Hospital Course (Addendum)
78 year old woman PMH including COPD presented with cough and shortness of breath despite outpatient treatment with oral antibiotics from PCP.  Was treated with IV Solu-Medrol and route.  No hypoxia below 88% documented.  Imaging was unremarkable.  Admitted for further observation and evaluation.  Consultants None  Procedures None

## 2024-07-06 ENCOUNTER — Encounter (HOSPITAL_COMMUNITY): Payer: Self-pay

## 2024-07-06 ENCOUNTER — Emergency Department (HOSPITAL_COMMUNITY)

## 2024-07-06 ENCOUNTER — Other Ambulatory Visit: Payer: Self-pay

## 2024-07-06 ENCOUNTER — Emergency Department (HOSPITAL_COMMUNITY)
Admission: EM | Admit: 2024-07-06 | Discharge: 2024-07-07 | Disposition: A | Discharge status: Home / Self Care | Attending: Emergency Medicine | Admitting: Emergency Medicine

## 2024-07-06 DIAGNOSIS — M25551 Pain in right hip: Secondary | ICD-10-CM | POA: Diagnosis present

## 2024-07-06 DIAGNOSIS — D72829 Elevated white blood cell count, unspecified: Secondary | ICD-10-CM | POA: Insufficient documentation

## 2024-07-06 DIAGNOSIS — R7309 Other abnormal glucose: Secondary | ICD-10-CM | POA: Insufficient documentation

## 2024-07-06 DIAGNOSIS — J449 Chronic obstructive pulmonary disease, unspecified: Secondary | ICD-10-CM | POA: Diagnosis not present

## 2024-07-06 DIAGNOSIS — Z79899 Other long term (current) drug therapy: Secondary | ICD-10-CM | POA: Insufficient documentation

## 2024-07-06 DIAGNOSIS — R739 Hyperglycemia, unspecified: Secondary | ICD-10-CM

## 2024-07-06 LAB — CBC WITH DIFFERENTIAL/PLATELET
Abs Immature Granulocytes: 0.04 K/uL (ref 0.00–0.07)
Basophils Absolute: 0.1 K/uL (ref 0.0–0.1)
Basophils Relative: 0 %
Eosinophils Absolute: 0.2 K/uL (ref 0.0–0.5)
Eosinophils Relative: 2 %
HCT: 43.3 % (ref 36.0–46.0)
Hemoglobin: 14 g/dL (ref 12.0–15.0)
Immature Granulocytes: 0 %
Lymphocytes Relative: 13 %
Lymphs Abs: 1.5 K/uL (ref 0.7–4.0)
MCH: 30.6 pg (ref 26.0–34.0)
MCHC: 32.3 g/dL (ref 30.0–36.0)
MCV: 94.7 fL (ref 80.0–100.0)
Monocytes Absolute: 0.6 K/uL (ref 0.1–1.0)
Monocytes Relative: 5 %
Neutro Abs: 9.7 K/uL — ABNORMAL HIGH (ref 1.7–7.7)
Neutrophils Relative %: 80 %
Platelets: 309 K/uL (ref 150–400)
RBC: 4.57 MIL/uL (ref 3.87–5.11)
RDW: 13 % (ref 11.5–15.5)
WBC: 12.1 K/uL — ABNORMAL HIGH (ref 4.0–10.5)
nRBC: 0 % (ref 0.0–0.2)

## 2024-07-06 LAB — COMPREHENSIVE METABOLIC PANEL WITH GFR
ALT: 17 U/L (ref 0–44)
AST: 17 U/L (ref 15–41)
Albumin: 3.9 g/dL (ref 3.5–5.0)
Alkaline Phosphatase: 98 U/L (ref 38–126)
Anion gap: 13 (ref 5–15)
BUN: 19 mg/dL (ref 8–23)
CO2: 24 mmol/L (ref 22–32)
Calcium: 9.5 mg/dL (ref 8.9–10.3)
Chloride: 104 mmol/L (ref 98–111)
Creatinine, Ser: 0.71 mg/dL (ref 0.44–1.00)
GFR, Estimated: >60 mL/min (ref >60)
Glucose, Bld: 114 mg/dL — ABNORMAL HIGH (ref 70–99)
Potassium: 3.8 mmol/L (ref 3.5–5.1)
Sodium: 140 mmol/L (ref 135–145)
Total Bilirubin: 0.5 mg/dL (ref 0.0–1.2)
Total Protein: 6.7 g/dL (ref 6.5–8.1)

## 2024-07-06 NOTE — ED Triage Notes (Signed)
 Right sided flank pain that radiates into lower abdomen since July, pain is worsening. Denies dysuria.

## 2024-07-06 NOTE — ED Provider Notes (Signed)
 [place] [facility] AT [facility] Provider Note   CSN: [id number] Arrival date & time: 07/06/24  1639     Patient presents with: Flank Pain   Diana Moody is a 78 y.o. female.  {Add pertinent medical, surgical, social history, OB history to YEP:67052} The history is provided by the patient.  Flank Pain   She has a history of COPD and comes in complaining of pain in the right iliac area for the last 2 months.  Pain tends to be worse if she is up on her feet and walking and worse as the day goes on.  Pain is also worse if she twists to the left.  She has taken acetaminophen  which gives her slight, temporary relief.  Pain does not radiate.  She denies nausea or vomiting and denies any urinary difficulty.  She went to an urgent care center this past week and got an injection which did seem to give some relief for several days.  This symptoms have been stable over the last month.    Prior to Admission medications   Medication Sig Start Date End Date Taking? Authorizing Provider  albuterol  (VENTOLIN  HFA) 108 (90 Base) MCG/ACT inhaler Inhale 2 puffs into the lungs as needed for wheezing or shortness of breath. 08/03/23   Jadine Toribio SQUIBB, MD  amLODipine  (NORVASC ) 5 MG tablet Take by mouth. 11/10/22   [provider]  INCRUSE ELLIPTA 62.5 MCG/ACT AEPB TAKE 1 PUFF BY MOUTH EVERY DAY 06/16/22   [provider]  rosuvastatin (CRESTOR) 10 MG tablet Take 10 mg by mouth every evening. 11/10/22   [provider]  SYMBICORT  160-4.5 MCG/ACT inhaler Inhale 2 puffs into the lungs in the morning and at bedtime. 08/03/23   Jadine Toribio SQUIBB, MD    Allergies: Patient has no known allergies.    Review of Systems  Genitourinary:  Positive for flank pain.  All other systems reviewed and are negative.   Updated Vital Signs BP (!) 157/78 (BP Location: Left Arm)   Pulse 76   Temp 97.9 F (36.6 C) (Oral)   Resp 18   Ht 5' 3 (1.6 m)   Wt 88.5 kg    SpO2 97%   BMI 34.54 kg/m   Physical Exam Vitals and nursing note reviewed.   78 year old female, resting comfortably and in no acute distress. Vital signs are significant for elevated blood pressure. Oxygen saturation is 98%, which is normal. Back is nontender and there is no CVA tenderness.  Straight leg raise is positive bilaterally at 60 degrees, but elicits pain in the popliteal area and not in the back or the area where she feels her pain. Lungs are clear without rales, wheezes, or rhonchi. Heart has regular rate and rhythm without murmur. Abdomen is soft, flat, nontender. Extremities have no cyanosis or edema, full range of motion is present. Skin is warm and dry without rash. Neurologic: Mental status is normal, cranial nerves are intact, strength is 5/5 in all 4 extremities, sensory exam is normal.  (all labs ordered are listed, but only abnormal results are displayed) Labs Reviewed  CBC WITH DIFFERENTIAL/PLATELET - Abnormal; Notable for the following components:      Result Value   WBC 12.1 (*)    Neutro Abs 9.7 (*)    All other components within normal limits  COMPREHENSIVE METABOLIC PANEL WITH GFR - Abnormal; Notable for the following components:   Glucose, Bld 114 (*)    All other components within normal  limits    Radiology: DG Hip Unilat W or Wo Pelvis 2-3 Views Right Result Date: 07/06/2024 CLINICAL DATA:  Right hip pain. EXAM: DG HIP (WITH OR WITHOUT PELVIS) 2-3V RIGHT COMPARISON:  None Available. FINDINGS: There is no evidence of hip fracture or dislocation. There is no evidence of arthropathy or other focal bone abnormality. Unremarkable soft tissues. IMPRESSION: Negative radiographs of the pelvis and right hip. Electronically Signed   By: Andrea Gasman M.D.   On: 07/06/2024 18:04    {Document cardiac monitor, telemetry assessment procedure when appropriate:32947} Procedures   Medications Ordered in the ED - No data to display    {Click here for ABCD2,  HEART and other calculators REFRESH Note before signing:1}                              Medical Decision Making  Pain in the region of the right iliac crest which seems most likely to be muscular based on the pattern of pain.  Exam is unremarkable.  Right hip x-ray shows no acute process and no significant arthritic changes.  Have independently viewed the images, and agree with radiologist interpretation.  I have reviewed her laboratory test, my interpretation is elevated random glucose level, mild leukocytosis which is nonspecific, normal hemoglobin.  I have ordered renal stone protocol CT scan to further evaluate for possible urolithiasis or other more subtle bony abnormalities.  {Document critical care time when appropriate  Document review of labs and clinical decision tools ie CHADS2VASC2, etc  Document your independent review of radiology images and any outside records  Document your discussion with family members, caretakers and with consultants  Document social determinants of health affecting pt's care  Document your decision making why or why not admission, treatments were needed:32947:::1}   Final diagnoses:  None    ED Discharge Orders     None

## 2024-07-06 NOTE — ED Provider Triage Note (Signed)
 Emergency Medicine Provider Triage Evaluation Note  Diana Moody , a 78 y.o. female  was evaluated in triage.  Pt complains of right sided pain has been present since July.  She states that she had a kidney stone initially and was treated for that and pain is lingered.  Hurts more with ambulation.  Better with rest.  Had a normal urinalysis done last week.  No urinary symptoms.  Review of Systems  Positive:  Negative: See above   Physical Exam  BP (!) 152/86   Pulse 87   Temp 98.2 F (36.8 C) (Oral)   Resp 18   Ht 5' 3 (1.6 m)   Wt 88.5 kg   SpO2 98%   BMI 34.54 kg/m  Gen:   Awake, no distress   Resp:  Normal effort  MSK:   Moves extremities without difficulty  Other:  [place] to palpation to the right hip   Medical Decision Making  Medically screening exam initiated at 5:10 PM.  Appropriate orders placed.  Pamula Luther was informed that the remainder of the evaluation will be completed by another provider, this initial triage assessment does not replace that evaluation, and the importance of remaining in the ED until their evaluation is complete.     Theotis Peers [place], [place] 07/06/24 1712

## 2024-07-07 ENCOUNTER — Emergency Department (HOSPITAL_COMMUNITY)

## 2024-07-07 MED ORDER — NAPROXEN 375 MG PO TABS: 375.0000 mg | ORAL_TABLET | Freq: Two times a day (BID) | ORAL | 0 refills | Status: AC

## 2024-07-07 NOTE — Discharge Instructions (Signed)
 The exact cause of your pain is not clear, but I suspect that you have some muscle injury that has is causing it.  Please try applying ice or heat to see which gives you better relief.  Please take the naproxen  which has been prescribed for you.  This should be taken twice a day.  To get additional pain relief, you may add acetaminophen .  Acetaminophen  and naproxen  work on pain and completely different ways, and when combined, give you better pain relief than either medication by itself will give you.  Please follow-up with the orthopedic doctor.  I suspect that you will need physical therapy to get over this.  In the meantime, I recommend that you use a walker as needed.
